# Patient Record
Sex: Male | Born: 1985 | Race: White | Hispanic: No | Marital: Single | Smoking: Current every day smoker
Health system: Southern US, Community
[De-identification: ages and names within clinical notes are randomized; demographics above are authoritative.]

## PROBLEM LIST (undated history)

## (undated) DIAGNOSIS — R011 Cardiac murmur, unspecified: Secondary | ICD-10-CM

## (undated) HISTORY — PX: WISDOM TOOTH EXTRACTION: SHX21

## (undated) HISTORY — DX: Cardiac murmur, unspecified: R01.1

---

## 2016-03-08 ENCOUNTER — Encounter: Payer: Self-pay | Admitting: Emergency Medicine

## 2016-03-08 ENCOUNTER — Ambulatory Visit
Admission: EM | Admit: 2016-03-08 | Discharge: 2016-03-08 | Disposition: A | Discharge status: Home / Self Care | Payer: BLUE CROSS/BLUE SHIELD | Attending: Internal Medicine | Admitting: Internal Medicine

## 2016-03-08 ENCOUNTER — Ambulatory Visit
Admit: 2016-03-08 | Discharge: 2016-03-08 | Disposition: A | Discharge status: Home / Self Care | Payer: BLUE CROSS/BLUE SHIELD | Attending: Emergency Medicine | Admitting: Emergency Medicine

## 2016-03-08 DIAGNOSIS — N451 Epididymitis: Secondary | ICD-10-CM | POA: Diagnosis not present

## 2016-03-08 DIAGNOSIS — N492 Inflammatory disorders of scrotum: Secondary | ICD-10-CM | POA: Diagnosis not present

## 2016-03-08 DIAGNOSIS — N5089 Other specified disorders of the male genital organs: Secondary | ICD-10-CM | POA: Diagnosis present

## 2016-03-08 MED ORDER — CEFTRIAXONE SODIUM 250 MG IJ SOLR
250.0000 mg | Freq: Once | INTRAMUSCULAR | Status: AC
  Administered 2016-03-08: 250 mg via INTRAMUSCULAR

## 2016-03-08 MED ORDER — SULFAMETHOXAZOLE-TRIMETHOPRIM 800-160 MG PO TABS: 1.0000 | ORAL_TABLET | Freq: Two times a day (BID) | ORAL | 0 refills | Status: DC

## 2016-03-08 MED ORDER — AZITHROMYCIN 500 MG PO TABS
1000.0000 mg | ORAL_TABLET | Freq: Once | ORAL | Status: AC
  Administered 2016-03-08: 1000 mg via ORAL

## 2016-03-08 MED ORDER — ONDANSETRON 8 MG PO TBDP
8.0000 mg | ORAL_TABLET | Freq: Once | ORAL | Status: AC
  Administered 2016-03-08: 8 mg via ORAL

## 2016-03-08 MED ORDER — LEVOFLOXACIN 500 MG PO TABS: 500.0000 mg | ORAL_TABLET | Freq: Every day | ORAL | 0 refills | Status: DC

## 2016-03-08 MED ORDER — MUPIROCIN 2 % EX OINT: 1.0000 "application " | TOPICAL_OINTMENT | Freq: Three times a day (TID) | CUTANEOUS | 0 refills | Status: DC

## 2016-03-08 NOTE — ED Triage Notes (Signed)
Patient c/o tender sore on the left side of his froin for 5 days.  Patient reports some drainage.

## 2016-03-08 NOTE — ED Provider Notes (Signed)
CSN: [id number]     Arrival date & time 03/08/16  1048 History   None    Chief Complaint  Patient presents with  . Abscess   (Consider location/radiation/quality/duration/timing/severity/associated sxs/prior Treatment) HPI  This a 30 year old male presents with a 5 day history of swelling and pain in his left groin. He states that has progressively worsened especially  today when he first stood up to walk was unable to because of the severe pain that is experiencing. He came to our clinic and while sitting in the waiting room he began to drain purulent bloody material and actually became less painful. He has not had any fever or chills and is afebrile today. In retrospect, he does remember having a severe itching in the groin that he scratched that may have possibly started the infection. Is  In monogamous heterosexual relationship for many months. He denies any anal sexual encounters.      History reviewed. No pertinent past medical history. Past Surgical History:  Procedure Laterality Date  . WISDOM TOOTH EXTRACTION     History reviewed. No pertinent family history. Social History  Substance Use Topics  . Smoking status: Current Every Day Smoker    Packs/day: 2.00    Types: Cigarettes  . Smokeless tobacco: Current User  . Alcohol use Yes    Review of Systems  Constitutional: Positive for activity change. Negative for chills, fatigue and fever.  Genitourinary: Positive for scrotal swelling.  All other systems reviewed and are negative.   Allergies  Review of patient's allergies indicates no known allergies.  Home Medications   Prior to Admission medications   Medication Sig Start Date End Date Taking? Authorizing Provider  levofloxacin (LEVAQUIN) 500 MG tablet Take 1 tablet (500 mg total) by mouth daily. 03/08/16   Lutricia Feil, PA-C  mupirocin ointment (BACTROBAN) 2 % Apply 1 application topically 3 (three) times daily. 03/08/16   Lutricia Feil, PA-C   sulfamethoxazole-trimethoprim (BACTRIM DS,SEPTRA DS) 800-160 MG tablet Take 1 tablet by mouth 2 (two) times daily. 03/08/16   Lutricia Feil, PA-C   Meds Ordered and Administered this Visit   Medications  ondansetron (ZOFRAN-ODT) disintegrating tablet 8 mg (8 mg Oral Given 03/08/16 1317)  azithromycin (ZITHROMAX) tablet 1,000 mg (1,000 mg Oral Given 03/08/16 1516)  cefTRIAXone (ROCEPHIN) injection 250 mg (250 mg Intramuscular Given 03/08/16 1519)    BP (!) 146/76 (BP Location: Left Arm)   Pulse 74   Temp 98.4 F (36.9 C) (Oral)   Resp 16   Ht 5\' 6"  (1.676 m)   Wt 150 lb (68 kg)   SpO2 99%   BMI 24.21 kg/m  No data found.   Physical Exam  Constitutional: He appears well-developed and well-nourished. No distress.  HENT:  Head: Normocephalic and atraumatic.  Eyes: EOM are normal. Pupils are equal, round, and reactive to light.  Neck: Normal range of motion. Neck supple.  Abdominal: Soft. Bowel sounds are normal. He exhibits no distension and no mass. There is no tenderness. There is no rebound and no guarding.  Genitourinary: Penis normal.  Genitourinary Comments: Examination of the scrotum shows swelling and induration along the skin. The testis is not involved. The induration is sensitive measuring 2.5" x 0.5". The central punctate area that is draining serosanguineous fluid. Gentle squeezing of the area also produce some deep thick purulent material. Is very tender to the touch.  Skin: He is not diaphoretic.  Nursing note and vitals reviewed.   Urgent Care Course  Clinical Course    Procedures (including critical care time)  Labs Review Labs Reviewed - No data to display  Imaging Review [place] Scrotum  Result Date: 03/08/2016 CLINICAL DATA:  Left-sided scrotal pain, swelling and redness. EXAM: SCROTAL ULTRASOUND DOPPLER ULTRASOUND OF THE TESTICLES TECHNIQUE: Complete ultrasound examination of the testicles, epididymis, and other scrotal structures was performed.  Color and spectral Doppler ultrasound were also utilized to evaluate blood flow to the testicles. COMPARISON:  None. FINDINGS: Right testicle Measurements: 4.2 x 2.3 x 3.3 cm. Symmetric and homogeneous echotexture without focal lesion. Patent arterial and venous blood flow. Left testicle Measurements: 3.9 x 2.3 x 3.2 cm. Symmetric and homogeneous echotexture without focal lesion. Patent arterial and venous blood flow. Right epididymis:  Normal in size and appearance. Left epididymis: Mildly enlarged and hyperemic suggesting epididymitis. Hydrocele:  None visualized. Varicocele:  None visualized. Other: Scrotal skin thickening, edema and erythema suggesting cellulitis. Pulsed Doppler interrogation of both testes demonstrates normal low resistance arterial and venous waveforms bilaterally. IMPRESSION: 1. Normal sonographic appearance of both testicles. 2. Left-sided epididymitis. 3. Asymmetric left-sided scrotal skin thickening, edema and erythema suggesting cellulitis. Electronically Signed   By: Rudie MeyerP.  Gallerani M.D.   On: 03/08/2016 14:25   [place] Art/ven Flow Abd Pelv Doppler  Result Date: 03/08/2016 CLINICAL DATA:  Left-sided scrotal pain, swelling and redness. EXAM: SCROTAL ULTRASOUND DOPPLER ULTRASOUND OF THE TESTICLES TECHNIQUE: Complete ultrasound examination of the testicles, epididymis, and other scrotal structures was performed. Color and spectral Doppler ultrasound were also utilized to evaluate blood flow to the testicles. COMPARISON:  None. FINDINGS: Right testicle Measurements: 4.2 x 2.3 x 3.3 cm. Symmetric and homogeneous echotexture without focal lesion. Patent arterial and venous blood flow. Left testicle Measurements: 3.9 x 2.3 x 3.2 cm. Symmetric and homogeneous echotexture without focal lesion. Patent arterial and venous blood flow. Right epididymis:  Normal in size and appearance. Left epididymis: Mildly enlarged and hyperemic suggesting epididymitis. Hydrocele:  None visualized. Varicocele:  None  visualized. Other: Scrotal skin thickening, edema and erythema suggesting cellulitis. Pulsed Doppler interrogation of both testes demonstrates normal low resistance arterial and venous waveforms bilaterally. IMPRESSION: 1. Normal sonographic appearance of both testicles. 2. Left-sided epididymitis. 3. Asymmetric left-sided scrotal skin thickening, edema and erythema suggesting cellulitis. Electronically Signed   By: Rudie MeyerP.  Gallerani M.D.   On: 03/08/2016 14:25     Visual Acuity Review  Right Eye Distance:   Left Eye Distance:   Bilateral Distance:    Right Eye Near:   Left Eye Near:    Bilateral Near:    Medications  ondansetron (ZOFRAN-ODT) disintegrating tablet 8 mg (8 mg Oral Given 03/08/16 1317)  azithromycin (ZITHROMAX) tablet 1,000 mg (1,000 mg Oral Given 03/08/16 1516)  cefTRIAXone (ROCEPHIN) injection 250 mg (250 mg Intramuscular Given 03/08/16 1519)       MDM   1. Scrotal wall abscess   2. Epididymitis, left    Discharge Medication List as of 03/08/2016  3:22 PM    START taking these medications   Details  levofloxacin (LEVAQUIN) 500 MG tablet Take 1 tablet (500 mg total) by mouth daily., Starting Tue 03/08/2016, Normal    sulfamethoxazole-trimethoprim (BACTRIM DS,SEPTRA DS) 800-160 MG tablet Take 1 tablet by mouth 2 (two) times daily., Starting Tue 03/08/2016, Normal      Plan: 1. Test/x-ray results and diagnosis reviewed with patient 2. rx as per orders; risks, benefits, potential side effects reviewed with patient 3. Recommend supportive treatment with Compresses 4 times a day for 10 minutes each time.  Dry thoroughly and apply Bactroban ointment to the indurated area. This was explained in detail to the patient and his girlfriend. He understands that he will be on 2 separate antibiotics 1 for the epididymitis and the other for the cellulitis. Will follow him in 2 days. If he is not improving or is worsening or is running a fever we will then refer him to a urologist. I  will keep him out of work for today and tomorrow and then reevaluate him for work status at his return on Thursday. 4. F/u prn if symptoms worsen or don't improve     Lutricia Feil, PA-C 03/08/16 1537

## 2016-03-10 ENCOUNTER — Telehealth: Payer: Self-pay | Admitting: *Deleted

## 2016-03-10 NOTE — Telephone Encounter (Signed)
Courtesy call, verified DOB, patient reported feeling better. Advised patient to follow up with PCP or [facility] if symptoms return.

## 2017-05-01 IMAGING — US US SCROTUM
1 series · 13 of 25 positions shown · non-contrast
Comparison: None.

CLINICAL DATA: Left-sided scrotal pain, swelling and redness.

EXAM:
SCROTAL ULTRASOUND
DOPPLER ULTRASOUND OF THE TESTICLES
TECHNIQUE: Complete ultrasound examination of the testicles, epididymis, and
other scrotal structures was performed. Color and spectral Doppler
ultrasound were also utilized to evaluate blood flow to the
testicles.

[Series 1: us scrotum · 0.07mm/px · 75 acquisitions, 13 frames shown]
[im 1/75]
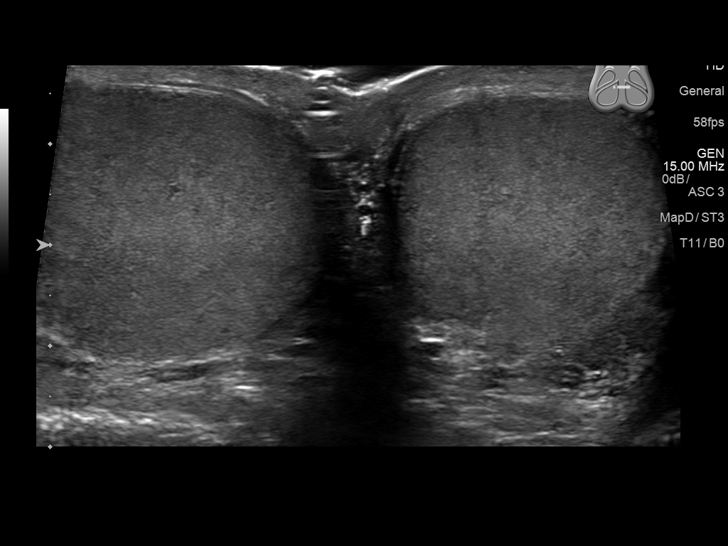
[im 7/75]
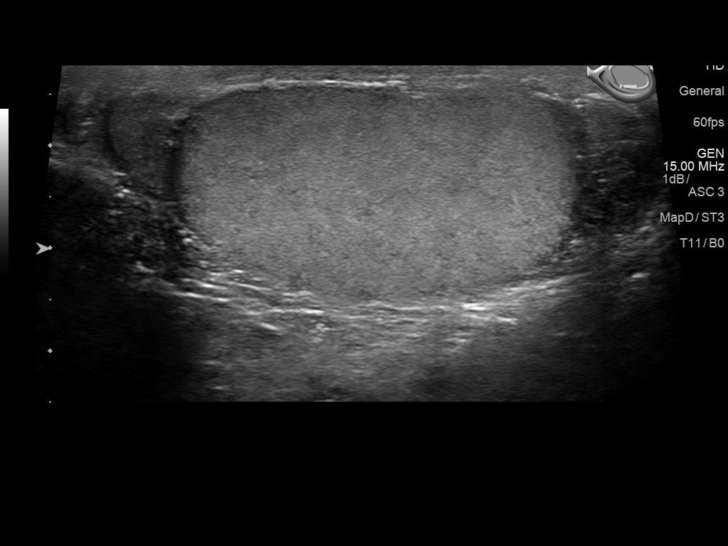
[im 13/75]
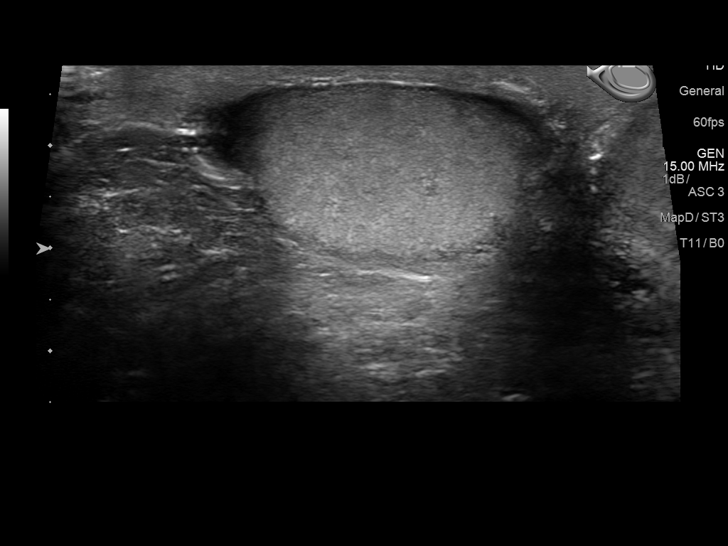
[im 19/75]
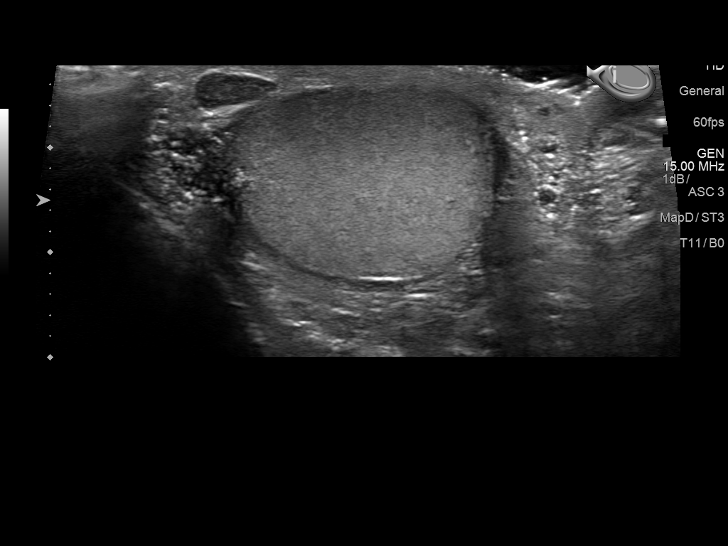
[im 25/75]
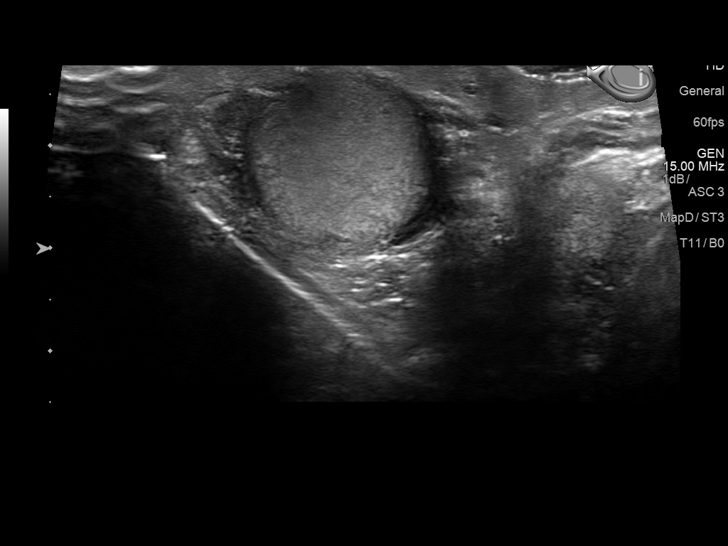
[im 31/75]
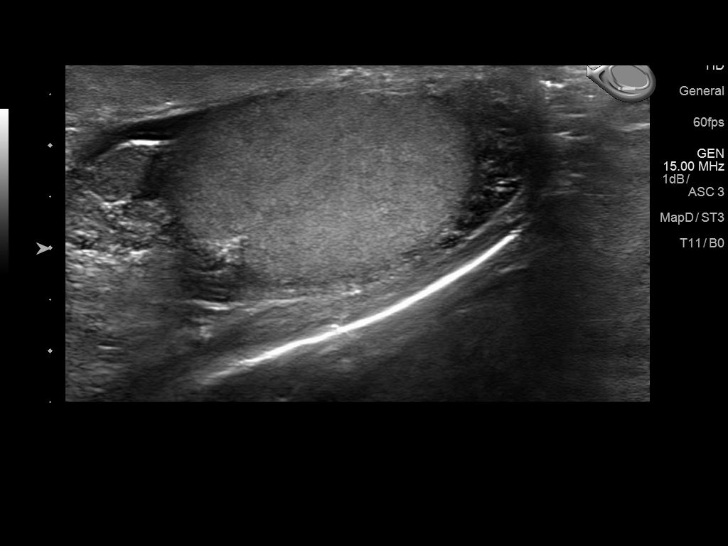
[im 38/75]
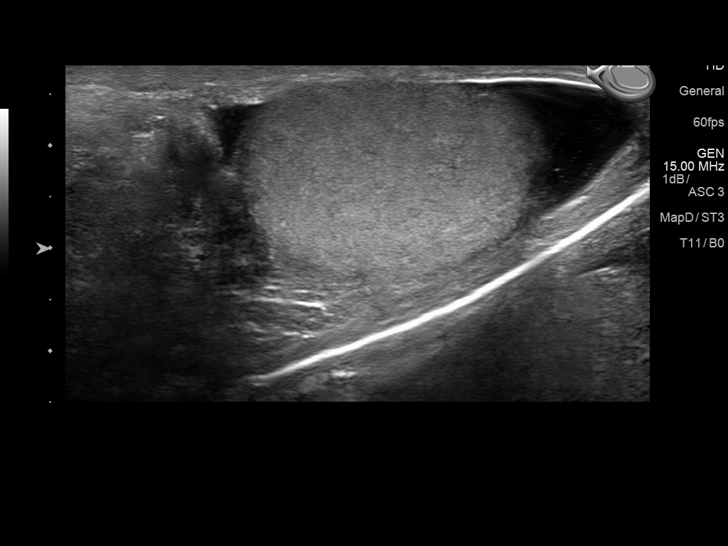
[im 44/75]
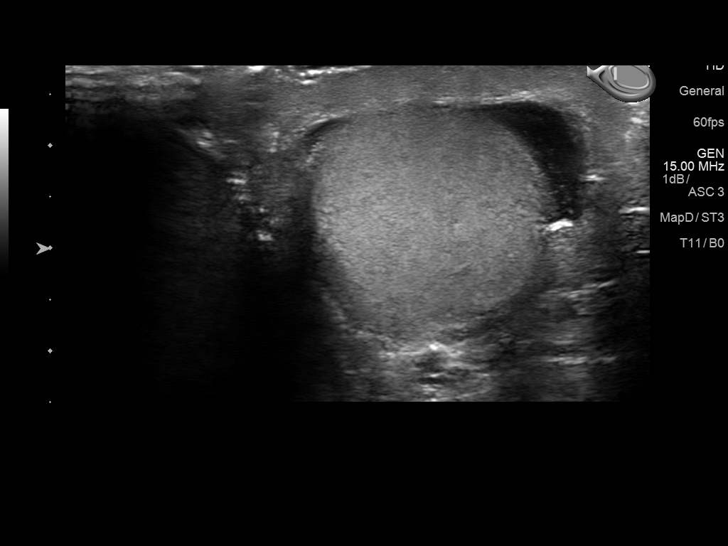
[im 50/75]
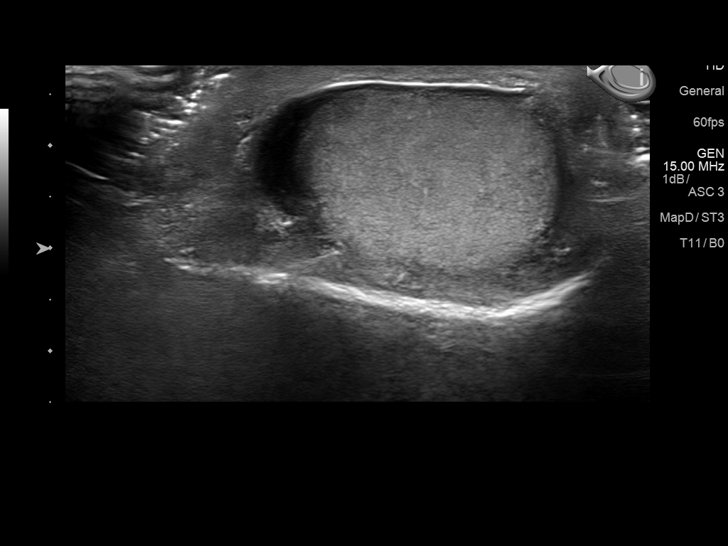
[im 56/75]
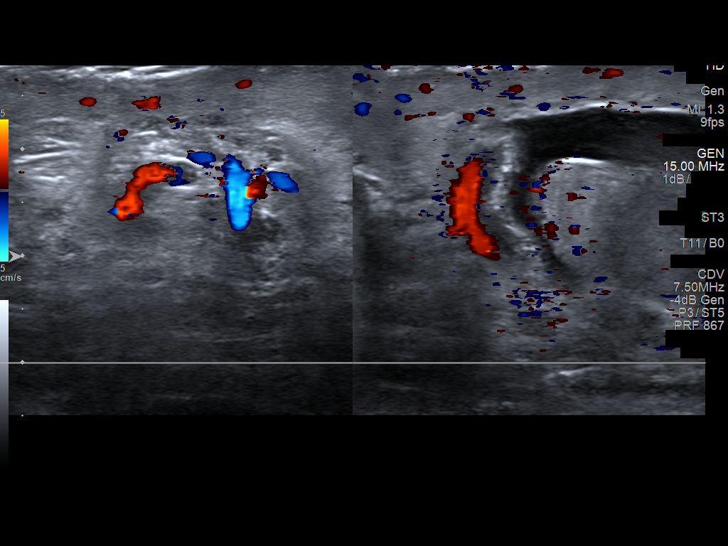
[im 62/75]
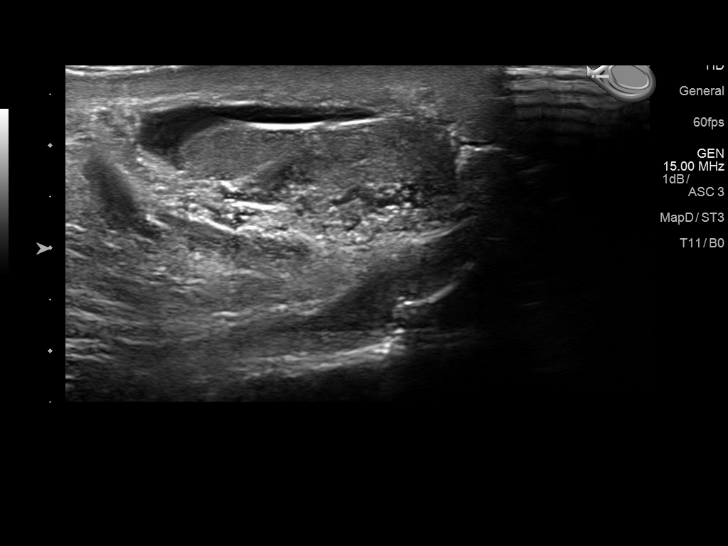
[im 68/75]
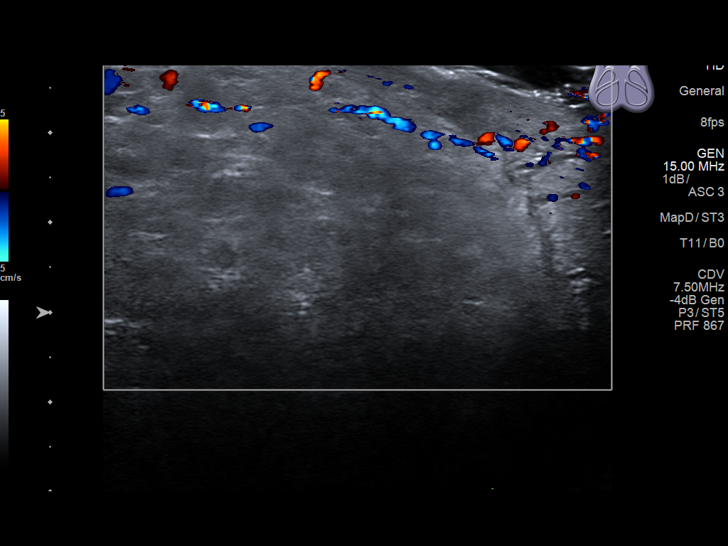
[im 75/75]
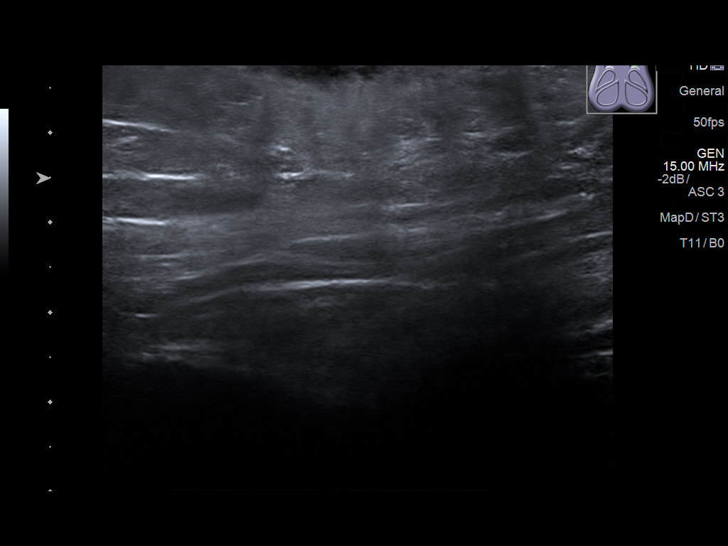

[13 of 25 positions shown; findings below may reference images not displayed]

FINDINGS: Right testicle

Measurements: 4.2 x 2.3 x 3.3 cm. Symmetric and homogeneous
echotexture without focal lesion. Patent arterial and venous blood
flow.

Left testicle

Measurements: 3.9 x 2.3 x 3.2 cm. Symmetric and homogeneous
echotexture without focal lesion. Patent arterial and venous blood
flow.

Right epididymis:  Normal in size and appearance.

Left epididymis: Mildly enlarged and hyperemic suggesting
epididymitis.

Hydrocele:  None visualized.

Varicocele:  None visualized.

Other: Scrotal skin thickening, edema and erythema suggesting
cellulitis.

Pulsed Doppler interrogation of both testes demonstrates normal low
resistance arterial and venous waveforms bilaterally.
IMPRESSION: 1. Normal sonographic appearance of both testicles.
2. Left-sided epididymitis.
3. Asymmetric left-sided scrotal skin thickening, edema and erythema
suggesting cellulitis.

## 2018-08-17 ENCOUNTER — Telehealth: Payer: BLUE CROSS/BLUE SHIELD | Admitting: Nurse Practitioner

## 2018-08-17 DIAGNOSIS — R05 Cough: Secondary | ICD-10-CM | POA: Diagnosis not present

## 2018-08-17 DIAGNOSIS — R059 Cough, unspecified: Secondary | ICD-10-CM

## 2018-08-17 NOTE — Progress Notes (Signed)

## 2018-08-29 ENCOUNTER — Telehealth: Payer: BLUE CROSS/BLUE SHIELD | Admitting: Nurse Practitioner

## 2018-08-29 DIAGNOSIS — J069 Acute upper respiratory infection, unspecified: Secondary | ICD-10-CM | POA: Diagnosis not present

## 2018-08-29 MED ORDER — AMOXICILLIN-POT CLAVULANATE 875-125 MG PO TABS: 1.0000 | ORAL_TABLET | Freq: Two times a day (BID) | ORAL | 0 refills | Status: DC

## 2018-08-29 MED ORDER — BENZONATATE 100 MG PO CAPS: 100.0000 mg | ORAL_CAPSULE | Freq: Three times a day (TID) | ORAL | 0 refills | Status: DC | PRN

## 2018-08-29 MED ORDER — FLUTICASONE PROPIONATE 50 MCG/ACT NA SUSP: 2.0000 | Freq: Every day | NASAL | 6 refills | Status: AC

## 2018-08-29 NOTE — Progress Notes (Signed)
We are sorry you are not feeling well.  Here is how we plan to help!  Based on what you have shared with me, it looks like you may have a viral upper respiratory infection.  Upper respiratory infections are caused by a large number of viruses; however, rhinovirus is the most common cause.   Symptoms vary from person to person, with common symptoms including sore throat, cough, and fatigue or lack of energy.  A low-grade fever of up to 100.4 may present, but is often uncommon.  Symptoms vary however, and are closely related to a person's age or underlying illnesses.  The most common symptoms associated with an upper respiratory infection are nasal discharge or congestion, cough, sneezing, headache and pressure in the ears and face.  These symptoms usually persist for about 3 to 10 days, but can last up to 2 weeks.  It is important to know that upper respiratory infections do not cause serious illness or complications in most cases.    Upper respiratory infections can be transmitted from person to person, with the most common method of transmission being a person's hands.  The virus is able to live on the skin and can infect other persons for up to 2 hours after direct contact.  Also, these can be transmitted when someone coughs or sneezes; thus, it is important to cover the mouth to reduce this risk.  To keep the spread of the illness at bay, good hand hygiene is very important.  This is an infection that is most likely caused by a virus. There are no specific treatments other than to help you with the symptoms until the infection runs its course.  We are sorry you are not feeling well.  Here is how we plan to help!   For nasal congestion, you may use an oral decongestants such as Mucinex D or if you have glaucoma or high blood pressure use plain Mucinex.  Saline nasal spray or nasal drops can help and can safely be used as often as needed for congestion.  For your congestion, I have prescribed Fluticasone  nasal spray one spray in each nostril twice a day  If you do not have a history of heart disease, hypertension, diabetes or thyroid disease, prostate/bladder issues or glaucoma, you may also use Sudafed to treat nasal congestion.  It is highly recommended that you consult with a pharmacist or your primary care physician to ensure this medication is safe for you to take.     If you have a cough, you may use cough suppressants such as Delsym and Robitussin.  If you have glaucoma or high blood pressure, you can also use Coricidin HBP.   For cough I have prescribed for you A prescription cough medication called Tessalon Perles 100 mg. You may take 1-2 capsules every 8 hours as needed for cough  If you have a sore or scratchy throat, use a saltwater gargle-  to  teaspoon of salt dissolved in a 4-ounce to 8-ounce glass of warm water.  Gargle the solution for approximately 15-30 seconds and then spit.  It is important not to swallow the solution.  You can also use throat lozenges/cough drops and Chloraseptic spray to help with throat pain or discomfort.  Warm or cold liquids can also be helpful in relieving throat pain.  For headache, pain or general discomfort, you can use Ibuprofen or Tylenol as directed.   Some authorities believe that zinc sprays or the use of Echinacea may shorten the   course of your symptoms.   HOME CARE . Only take medications as instructed by your medical team. . Be sure to drink plenty of fluids. Water is fine as well as fruit juices, sodas and electrolyte beverages. You may want to stay away from caffeine or alcohol. If you are nauseated, try taking small sips of liquids. How do you know if you are getting enough fluid? Your urine should be a pale yellow or almost colorless. . Get rest. . Taking a steamy shower or using a humidifier may help nasal congestion and ease sore throat pain. You can place a towel over your head and breathe in the steam from hot water coming from a  faucet. . Using a saline nasal spray works much the same way. . Cough drops, hard candies and sore throat lozenges may ease your cough. . Avoid close contacts especially the very young and the elderly . Cover your mouth if you cough or sneeze . Always remember to wash your hands.   GET HELP RIGHT AWAY IF: . You develop worsening fever. . If your symptoms do not improve within 10 days . You develop yellow or green discharge from your nose over 3 days. . You have coughing fits . You develop a severe head ache or visual changes. . You develop shortness of breath, difficulty breathing or start having chest pain . Your symptoms persist after you have completed your treatment plan  MAKE SURE YOU   Understand these instructions.  Will watch your condition.  Will get help right away if you are not doing well or get worse.  Your e-visit answers were reviewed by a board certified advanced clinical practitioner to complete your personal care plan. Depending upon the condition, your plan could have included both over the counter or prescription medications. Please review your pharmacy choice. If there is a problem, you may call our nursing hot line at and have the prescription routed to another pharmacy. Your safety is important to us. If you have drug allergies check your prescription carefully.   You can use MyChart to ask questions about today's visit, request a non-urgent call back, or ask for a work or school excuse for 24 hours related to this e-Visit. If it has been greater than 24 hours you will need to follow up with your provider, or enter a new e-Visit to address those concerns. You will get an e-mail in the next two days asking about your experience.  I hope that your e-visit has been valuable and will speed your recovery. Thank you for using e-visits.   5 minutes spent reviewing and documenting in chart.    

## 2018-08-29 NOTE — Addendum Note (Signed)
Addended by: Bennie Pierini on: 08/29/2018 01:31 PM   Modules accepted: Orders

## 2018-08-29 NOTE — Progress Notes (Signed)

## 2018-09-19 ENCOUNTER — Encounter: Payer: Self-pay | Admitting: Nurse Practitioner

## 2018-09-19 ENCOUNTER — Ambulatory Visit (INDEPENDENT_AMBULATORY_CARE_PROVIDER_SITE_OTHER): Payer: BC Managed Care – PPO | Admitting: Nurse Practitioner

## 2018-09-19 ENCOUNTER — Other Ambulatory Visit: Payer: Self-pay

## 2018-09-19 VITALS — Temp 97.2°F | Ht 66.0 in | Wt 155.0 lb

## 2018-09-19 DIAGNOSIS — J301 Allergic rhinitis due to pollen: Secondary | ICD-10-CM

## 2018-09-19 DIAGNOSIS — F17210 Nicotine dependence, cigarettes, uncomplicated: Secondary | ICD-10-CM | POA: Insufficient documentation

## 2018-09-19 DIAGNOSIS — J309 Allergic rhinitis, unspecified: Secondary | ICD-10-CM | POA: Insufficient documentation

## 2018-09-19 NOTE — Assessment & Plan Note (Signed)
Chronic, stable with Flonase.  Continue current medication regimen.

## 2018-09-19 NOTE — Assessment & Plan Note (Signed)
I have recommended complete cessation of tobacco use. I have discussed various options available for assistance with tobacco cessation including over the counter methods (Nicotine gum, patch and lozenges). We also discussed prescription options (Chantix, Nicotine Inhaler / Nasal Spray). The patient is not interested in pursuing any prescription tobacco cessation options at this time.  

## 2018-09-19 NOTE — Progress Notes (Signed)
New Patient Office Visit  Subjective:  Patient ID: Jimmy CoppJames Gernert, male    DOB: 06-Sep-1985  Age: 33 y.o. MRN: [id number]  CC:  Chief Complaint  Patient presents with  . Establish Care    . This visit was completed via WebEx due to the restrictions of the COVID-19 pandemic. All issues as above were discussed and addressed. Physical exam was done as above through visual confirmation on WebEx. If it was felt that the patient should be evaluated in the office, they were directed there. The patient verbally consented to this visit. . Location of the patient: home . Location of the provider: work . Those involved with this call:  . Provider: Aura DialsJolene Taneesha Edgin, DNP . CMA: Elton SinAnita Quito, CMA . Front Desk/Registration: Harriet PhoJoliza Johnson  . Time spent on call: 20 minutes with patient face to face via video conference. More than 50% of this time was spent in counseling and coordination of care. 5 minutes total spent in review of patient's record and preparation of their chart. I verified patient identity using two factors (patient name and date of birth). Patient consents verbally to being seen via telemedicine visit today.   HPI Jimmy Shannon presents for new patient visit to establish care.  Introduced to Publishing rights managernurse practitioner role and practice setting.  All questions answered.  He denies any health concerns or questions today.  Did recently get treated for a URI, which he reports has now improved.  Reports overall he is "healthy", with no major health concerns at this time.  SMOKER: Currently smokes approx. 3-4 cigarettes a day and does vape on occasion.  He is interested in quitting and has been cutting back himself.  Is not interested in medication for assistance at this time.  Has smoked since age 33.  Denies cough, SOB, fever, or wheezing.  ALLERGIES Uses Flonase with benefit. Duration: chronic Runny nose: no none Nasal congestion: no Nasal itching: no Sneezing: no Eye swelling, itching or  discharge: no Post nasal drip: no Cough: no Sinus pressure: no  Fever: no none Symptoms occur seasonally: yes Symptoms occur perenially: no Satisfied with current treatment: yes Allergist evaluation in past: no Allergen injection immunotherapy: no Recurrent sinus infections: no ENT evaluation in past: no Known environmental allergy: no Indoor pets: no History of asthma: no Current allergy medications: Flonase Treatments attempted:Flonase and Claritin  Past Medical History:  Diagnosis Date  . Heart murmur     Past Surgical History:  Procedure Laterality Date  . WISDOM TOOTH EXTRACTION      Family History  Problem Relation Age of Onset  . Diabetes Mother   . Liver disease Mother   . Diabetes Father   . Hypertension Father   . Lung cancer Maternal Grandfather   . Heart attack Paternal Grandfather     Social History   Socioeconomic History  . Marital status: Single    Spouse name: Not on file  . Number of children: Not on file  . Years of education: Not on file  . Highest education level: Not on file  Occupational History  . Not on file  Social Needs  . Financial resource strain: Not hard at all  . Food insecurity:    Worry: Never true    Inability: Never true  . Transportation needs:    Medical: No    Non-medical: No  Tobacco Use  . Smoking status: Current Every Day Smoker    Packs/day: 0.25    Years: 15.00    Pack years: 3.75  Types: Cigarettes  . Smokeless tobacco: Never Used  Substance and Sexual Activity  . Alcohol use: Yes    Alcohol/week: 2.0 standard drinks    Types: 2 Cans of beer per week  . Drug use: No  . Sexual activity: Not Currently  Lifestyle  . Physical activity:    Days per week: 4 days    Minutes per session: 30 min  . Stress: Not at all  Relationships  . Social connections:    Talks on phone: Three times a week    Gets together: Three times a week    Attends religious service: Never    Active member of club or  organization: No    Attends meetings of clubs or organizations: Never    Relationship status: Not on file  . Intimate partner violence:    Fear of current or ex partner: No    Emotionally abused: No    Physically abused: No    Forced sexual activity: No  Other Topics Concern  . Not on file  Social History Narrative  . Not on file    ROS Review of Systems  Constitutional: Negative for activity change, diaphoresis, fatigue and fever.  Respiratory: Negative for cough, chest tightness, shortness of breath and wheezing.   Cardiovascular: Negative for chest pain, palpitations and leg swelling.  Gastrointestinal: Negative for abdominal distention, abdominal pain, constipation, diarrhea, nausea and vomiting.  Endocrine: Negative for cold intolerance, heat intolerance, polydipsia, polyphagia and polyuria.  Musculoskeletal: Negative.   Skin: Negative.   Neurological: Negative for dizziness, syncope, weakness, light-headedness, numbness and headaches.  Psychiatric/Behavioral: Negative.     Objective:   Today's Vitals: Temp (!) 97.2 F (36.2 C) (Oral)   Ht 5\' 6"  (1.676 m)   Wt 155 lb (70.3 kg)   BMI 25.02 kg/m   Physical Exam Vitals signs and nursing note reviewed.  Constitutional:      General: He is awake.     Appearance: He is well-developed. He is not ill-appearing.  HENT:     Head: Normocephalic and atraumatic.     Right Ear: Hearing normal. No drainage.     Left Ear: Hearing normal. No drainage.     Nose: Nose normal.     Mouth/Throat:     Lips: Pink.     Mouth: Mucous membranes are moist.  Eyes:     General: Lids are normal.        Right eye: No discharge.        Left eye: No discharge.     Conjunctiva/sclera: Conjunctivae normal.     Pupils: Pupils are equal, round, and reactive to light.  Neck:     Musculoskeletal: Normal range of motion.  Cardiovascular:     Comments: Unable to auscultate due to virtual visit.  Viewed legs and no edema noted. Pulmonary:      Effort: Pulmonary effort is normal. No accessory muscle usage or respiratory distress.     Comments: Unable to auscultate due to virtual visit.  No SOB with talking. Abdominal:     Palpations: Abdomen is soft.     Comments: He reports a soft abdomen with no pain on self palpation.  Musculoskeletal: Normal range of motion.     Right lower leg: No edema.     Left lower leg: No edema.     Comments: Virtual visit: Full ROM noted on visual exam with provider guidance through movements.  Skin:    General: Skin is warm and dry.  Neurological:  Mental Status: He is alert and oriented to person, place, and time.  Psychiatric:        Attention and Perception: Attention normal.        Mood and Affect: Mood normal.        Speech: Speech normal.        Behavior: Behavior normal. Behavior is cooperative.        Thought Content: Thought content normal.        Judgment: Judgment normal.     Assessment & Plan:   Problem List Items Addressed This Visit      Respiratory   Allergic rhinitis    Chronic, stable with Flonase.  Continue current medication regimen.        Other   Nicotine dependence, cigarettes, uncomplicated - Primary    I have recommended complete cessation of tobacco use. I have discussed various options available for assistance with tobacco cessation including over the counter methods (Nicotine gum, patch and lozenges). We also discussed prescription options (Chantix, Nicotine Inhaler / Nasal Spray). The patient is not interested in pursuing any prescription tobacco cessation options at this time.         Outpatient Encounter Medications as of 09/19/2018  Medication Sig  . fluticasone (FLONASE) 50 MCG/ACT nasal spray Place 2 sprays into both nostrils daily.  . [DISCONTINUED] amoxicillin-clavulanate (AUGMENTIN) 875-125 MG tablet Take 1 tablet by mouth 2 (two) times daily.  . [DISCONTINUED] benzonatate (TESSALON PERLES) 100 MG capsule Take 1 capsule (100 mg total) by mouth 3  (three) times daily as needed.  . [DISCONTINUED] levofloxacin (LEVAQUIN) 500 MG tablet Take 1 tablet (500 mg total) by mouth daily.  . [DISCONTINUED] mupirocin ointment (BACTROBAN) 2 % Apply 1 application topically 3 (three) times daily.  . [DISCONTINUED] sulfamethoxazole-trimethoprim (BACTRIM DS,SEPTRA DS) 800-160 MG tablet Take 1 tablet by mouth 2 (two) times daily.   No facility-administered encounter medications on file as of 09/19/2018.    I discussed the assessment and treatment plan with the patient. The patient was provided an opportunity to ask questions and all were answered. The patient agreed with the plan and demonstrated an understanding of the instructions.   The patient was advised to call back or seek an in-person evaluation if the symptoms worsen or if the condition fails to improve as anticipated.   I provided 20 minutes of time during this encounter.  Follow-up: Return in about 2 months (around 11/19/2018) for Physical exam with labs.   Marjie Skiff, NP

## 2018-09-19 NOTE — Patient Instructions (Signed)

## 2020-04-17 ENCOUNTER — Ambulatory Visit: Payer: BC Managed Care – PPO | Admitting: Nurse Practitioner

## 2021-05-31 ENCOUNTER — Encounter: Payer: Self-pay | Admitting: Internal Medicine

## 2021-05-31 ENCOUNTER — Ambulatory Visit (INDEPENDENT_AMBULATORY_CARE_PROVIDER_SITE_OTHER): Payer: BC Managed Care – PPO | Admitting: Internal Medicine

## 2021-05-31 VITALS — BP 143/92 | HR 72 | Ht 66.0 in | Wt 173.5 lb

## 2021-05-31 DIAGNOSIS — I10 Essential (primary) hypertension: Secondary | ICD-10-CM

## 2021-05-31 DIAGNOSIS — G4733 Obstructive sleep apnea (adult) (pediatric): Secondary | ICD-10-CM | POA: Insufficient documentation

## 2021-05-31 NOTE — Assessment & Plan Note (Signed)
We will do a sleep study to make sure patient does not have a sleep apnea, his blood pressure is borderline elevated, I would keep  A watch on that

## 2021-05-31 NOTE — Progress Notes (Signed)
New Patient Office Visit  Subjective:  Patient ID: Jimmy Shannon, male    DOB: June 25, 1985  Age: 36 y.o. MRN: [id number]  CC:  Chief Complaint  Patient presents with   New Patient (Initial Visit)    Insomnia Primary symptoms: fragmented sleep, sleep disturbance, difficulty falling asleep, somnolence, frequent awakening.   The problem occurs nightly. The problem is unchanged. The symptoms are aggravated by alcohol and anxiety. How many beverages per day that contain caffeine: 0 - 1 (wk).  Types of beverages you drink: coffee (none). Past treatments include alcohol (0 to 2 wk). PMH includes: no depression, no family stress or anxiety, no restless leg syndrome.   Patient presents for general check up/c/o tiredness, and snoring , bp is borderline elevated  Past Medical History:  Diagnosis Date   Heart murmur      Current Outpatient Medications:    fluticasone (FLONASE) 50 MCG/ACT nasal spray, Place 2 sprays into both nostrils daily., Disp: 16 g, Rfl: 6   Past Surgical History:  Procedure Laterality Date   WISDOM TOOTH EXTRACTION      Family History  Problem Relation Age of Onset   Diabetes Mother    Liver disease Mother    Diabetes Father    Hypertension Father    Lung cancer Maternal Grandfather    Heart attack Paternal Grandfather     Social History   Socioeconomic History   Marital status: Single    Spouse name: Not on file   Number of children: Not on file   Years of education: Not on file   Highest education level: Not on file  Occupational History   Not on file  Tobacco Use   Smoking status: Every Day    Packs/day: 0.25    Years: 15.00    Pack years: 3.75    Types: Cigarettes   Smokeless tobacco: Never  Vaping Use   Vaping Use: Some days  Substance and Sexual Activity   Alcohol use: Yes    Alcohol/week: 2.0 standard drinks    Types: 2 Cans of beer per week   Drug use: No   Sexual activity: Not Currently  Other Topics Concern   Not on file  Social  History Narrative   Not on file   Social Determinants of Health   Financial Resource Strain: Not on file  Food Insecurity: Not on file  Transportation Needs: Not on file  Physical Activity: Not on file  Stress: Not on file  Social Connections: Not on file  Intimate Partner Violence: Not on file    ROS Review of Systems  Constitutional: Negative.   HENT: Negative.    Eyes: Negative.   Respiratory: Negative.    Cardiovascular: Negative.   Gastrointestinal: Negative.   Endocrine: Negative.   Genitourinary: Negative.   Musculoskeletal: Negative.   Skin: Negative.   Allergic/Immunologic: Negative.   Neurological: Negative.   Hematological: Negative.   Psychiatric/Behavioral:  Positive for sleep disturbance. Negative for depression. The patient has insomnia.   All other systems reviewed and are negative.  Objective:   Today's Vitals: BP (!) 143/92    Pulse 72    Ht 5\' 6"  (1.676 m)    Wt 173 lb 8 oz (78.7 kg)    BMI 28.00 kg/m   Physical Exam Constitutional:      Appearance: Normal appearance.  HENT:     Head: Normocephalic and atraumatic.     Nose: Nose normal.     Mouth/Throat:     Mouth: Mucous  membranes are moist.  Eyes:     Pupils: Pupils are equal, round, and reactive to light.  Cardiovascular:     Rate and Rhythm: Normal rate and regular rhythm.     Pulses: Normal pulses.     Heart sounds: No murmur heard. Pulmonary:     Effort: No respiratory distress.     Breath sounds: No rhonchi.  Musculoskeletal:        General: Normal range of motion.  Skin:    General: Skin is warm.  Neurological:     General: No focal deficit present.     Mental Status: He is alert.    Assessment & Plan:   Problem List Items Addressed This Visit       Cardiovascular and Mediastinum   Hypertension - Primary     Patient denies any chest pain or shortness of breath there is no history of palpitation or paroxysmal nocturnal dyspnea   patient was advised to follow low-salt  low-cholesterol diet    ideally I want to keep systolic blood pressure below 130 mmHg, patient was asked to check blood pressure one times a week and give me a report on that.  Patient will be follow-up in 3 months  or earlier as needed, patient will call me back for any change in the cardiovascular symptoms Patient was advised to buy a book from local bookstore concerning blood pressure and read several chapters  every day.  This will be supplemented by some of the material we will give him from the office.  Patient should also utilize other resources like YouTube and Internet to learn more about the blood pressure and the diet.        Respiratory   OSA (obstructive sleep apnea)    We will do a sleep study to make sure patient does not have a sleep apnea, his blood pressure is borderline elevated, I would keep  A watch on that       Outpatient Encounter Medications as of 05/31/2021  Medication Sig   fluticasone (FLONASE) 50 MCG/ACT nasal spray Place 2 sprays into both nostrils daily.   No facility-administered encounter medications on file as of 05/31/2021.    Follow-up: No follow-ups on file.   Cletis Athens, MD

## 2021-05-31 NOTE — Assessment & Plan Note (Signed)

## 2021-05-31 NOTE — Addendum Note (Signed)
Addended by: Jobie Quaker on: 05/31/2021 01:15 PM   Modules accepted: Orders

## 2021-06-01 LAB — LIPID PANEL
Cholesterol: 223 mg/dL — ABNORMAL HIGH (ref <200)
HDL: 37 mg/dL — ABNORMAL LOW (ref >=40)
LDL Cholesterol (Calc): 149 mg/dL (calc) — ABNORMAL HIGH
Non-HDL Cholesterol (Calc): 186 mg/dL (calc) — ABNORMAL HIGH (ref <130)
Total CHOL/HDL Ratio: 6 (calc) — ABNORMAL HIGH (ref <5.0)
Triglycerides: 231 mg/dL — ABNORMAL HIGH (ref <150)

## 2021-06-01 LAB — CBC WITH DIFFERENTIAL/PLATELET
Absolute Monocytes: 624 cells/uL (ref 200–950)
Basophils Absolute: 101 cells/uL (ref 0–200)
Basophils Relative: 1.3 %
Eosinophils Absolute: 179 cells/uL (ref 15–500)
Eosinophils Relative: 2.3 %
HCT: 45 % (ref 38.5–50.0)
Hemoglobin: 15.3 g/dL (ref 13.2–17.1)
Lymphs Abs: 2083 cells/uL (ref 850–3900)
MCH: 31.9 pg (ref 27.0–33.0)
MCHC: 34 g/dL (ref 32.0–36.0)
MCV: 93.9 fL (ref 80.0–100.0)
MPV: 11.5 fL (ref 7.5–12.5)
Monocytes Relative: 8 %
Neutro Abs: 4813 cells/uL (ref 1500–7800)
Neutrophils Relative %: 61.7 %
Platelets: 277 10*3/uL (ref 140–400)
RBC: 4.79 10*6/uL (ref 4.20–5.80)
RDW: 11.8 % (ref 11.0–15.0)
Total Lymphocyte: 26.7 %
WBC: 7.8 10*3/uL (ref 3.8–10.8)

## 2021-06-01 LAB — COMPLETE METABOLIC PANEL WITH GFR
AG Ratio: 2 (calc) (ref 1.0–2.5)
ALT: 34 U/L (ref 9–46)
AST: 20 U/L (ref 10–40)
Albumin: 4.5 g/dL (ref 3.6–5.1)
Alkaline phosphatase (APISO): 86 U/L (ref 36–130)
BUN: 11 mg/dL (ref 7–25)
CO2: 26 mmol/L (ref 20–32)
Calcium: 9.5 mg/dL (ref 8.6–10.3)
Chloride: 103 mmol/L (ref 98–110)
Creat: 0.78 mg/dL (ref 0.60–1.26)
Globulin: 2.2 g/dL (calc) (ref 1.9–3.7)
Glucose, Bld: 92 mg/dL (ref 65–99)
Potassium: 4.2 mmol/L (ref 3.5–5.3)
Sodium: 140 mmol/L (ref 135–146)
Total Bilirubin: 0.6 mg/dL (ref 0.2–1.2)
Total Protein: 6.7 g/dL (ref 6.1–8.1)
eGFR: 119 mL/min/{1.73_m2} (ref >=60)

## 2021-06-01 LAB — TSH: TSH: 2.08 mIU/L (ref 0.40–4.50)

## 2021-06-07 ENCOUNTER — Ambulatory Visit: Payer: BC Managed Care – PPO | Admitting: Internal Medicine

## 2021-07-09 ENCOUNTER — Other Ambulatory Visit: Payer: Self-pay

## 2021-08-17 ENCOUNTER — Other Ambulatory Visit: Payer: Self-pay

## 2022-07-04 ENCOUNTER — Telehealth: Payer: BC Managed Care – PPO | Admitting: Physician Assistant

## 2022-07-04 DIAGNOSIS — J019 Acute sinusitis, unspecified: Secondary | ICD-10-CM

## 2022-07-04 DIAGNOSIS — B9689 Other specified bacterial agents as the cause of diseases classified elsewhere: Secondary | ICD-10-CM | POA: Diagnosis not present

## 2022-07-04 MED ORDER — AMOXICILLIN-POT CLAVULANATE 875-125 MG PO TABS: 1.0000 | ORAL_TABLET | Freq: Two times a day (BID) | ORAL | 0 refills | Status: AC

## 2022-07-04 NOTE — Progress Notes (Signed)

## 2022-07-04 NOTE — Progress Notes (Signed)
I have spent 5 minutes in review of e-visit questionnaire, review and updating patient chart, medical decision making and response to patient.   Laydon Martis Cody Geoge Lawrance, PA-C
